# Patient Record
Sex: Female | Born: 1955 | Race: White | Hispanic: No | Marital: Married | Smoking: Current every day smoker
Health system: Southern US, Community
[De-identification: ages and names within clinical notes are randomized; demographics above are authoritative.]

## PROBLEM LIST (undated history)

## (undated) HISTORY — PX: BREAST SURGERY: SHX581

---

## 2004-12-13 ENCOUNTER — Ambulatory Visit: Payer: Self-pay | Admitting: General Surgery

## 2006-01-14 ENCOUNTER — Ambulatory Visit: Payer: Self-pay | Admitting: General Surgery

## 2006-01-21 ENCOUNTER — Ambulatory Visit: Payer: Self-pay | Admitting: General Surgery

## 2007-01-27 ENCOUNTER — Ambulatory Visit: Payer: Self-pay | Admitting: General Surgery

## 2008-02-23 ENCOUNTER — Ambulatory Visit: Payer: Self-pay | Admitting: General Surgery

## 2009-03-01 ENCOUNTER — Ambulatory Visit: Payer: Self-pay | Admitting: General Surgery

## 2010-03-14 ENCOUNTER — Ambulatory Visit: Payer: Self-pay | Admitting: General Surgery

## 2010-06-01 ENCOUNTER — Ambulatory Visit: Payer: Self-pay | Admitting: General Surgery

## 2010-06-05 ENCOUNTER — Ambulatory Visit: Payer: Self-pay | Admitting: General Surgery

## 2018-06-22 ENCOUNTER — Emergency Department
Admission: EM | Admit: 2018-06-22 | Discharge: 2018-06-22 | Disposition: A | Discharge status: Home / Self Care | Payer: BLUE CROSS/BLUE SHIELD | Attending: Emergency Medicine | Admitting: Emergency Medicine

## 2018-06-22 ENCOUNTER — Other Ambulatory Visit: Payer: Self-pay

## 2018-06-22 ENCOUNTER — Encounter: Payer: Self-pay | Admitting: Emergency Medicine

## 2018-06-22 ENCOUNTER — Emergency Department: Payer: BLUE CROSS/BLUE SHIELD

## 2018-06-22 DIAGNOSIS — I10 Essential (primary) hypertension: Secondary | ICD-10-CM | POA: Diagnosis not present

## 2018-06-22 DIAGNOSIS — F419 Anxiety disorder, unspecified: Secondary | ICD-10-CM | POA: Insufficient documentation

## 2018-06-22 DIAGNOSIS — R002 Palpitations: Secondary | ICD-10-CM | POA: Diagnosis present

## 2018-06-22 LAB — BASIC METABOLIC PANEL
Anion gap: 11 (ref 5–15)
BUN: 11 mg/dL (ref 8–23)
CO2: 25 mmol/L (ref 22–32)
CREATININE: 1.03 mg/dL — AB (ref 0.44–1.00)
Calcium: 9.8 mg/dL (ref 8.9–10.3)
Chloride: 105 mmol/L (ref 98–111)
GFR calc non Af Amer: 57 mL/min — ABNORMAL LOW (ref >60)
Glucose, Bld: 122 mg/dL — ABNORMAL HIGH (ref 70–99)
Potassium: 4 mmol/L (ref 3.5–5.1)
Sodium: 141 mmol/L (ref 135–145)

## 2018-06-22 LAB — TROPONIN I

## 2018-06-22 LAB — CBC
HCT: 44.4 % (ref 35.0–47.0)
Hemoglobin: 15 g/dL (ref 12.0–16.0)
MCH: 31.8 pg (ref 26.0–34.0)
MCHC: 33.7 g/dL (ref 32.0–36.0)
MCV: 94.2 fL (ref 80.0–100.0)
Platelets: 334 10*3/uL (ref 150–440)
RBC: 4.71 MIL/uL (ref 3.80–5.20)
RDW: 13.3 % (ref 11.5–14.5)
WBC: 13.1 10*3/uL — AB (ref 3.6–11.0)

## 2018-06-22 MED ORDER — LORAZEPAM 1 MG PO TABS
1.0000 mg | ORAL_TABLET | Freq: Once | ORAL | Status: AC
  Administered 2018-06-22: 1 mg via ORAL
  Filled 2018-06-22: qty 1

## 2018-06-22 MED ORDER — LORAZEPAM 0.5 MG PO TABS: 0.5000 mg | ORAL_TABLET | Freq: Three times a day (TID) | ORAL | 0 refills | Status: AC | PRN

## 2018-06-22 NOTE — ED Triage Notes (Signed)
Pt reports since Friday she has been feeling her blood pressure high and reports at times she feels her "heart beat on he head" reports at times she feels like she is going to have a panic attack. Pt talks in compete sentences no respiratory distress noted

## 2018-06-22 NOTE — ED Notes (Addendum)
.  Patient verbalizes understanding of d/c instructions, medications, and follow-up. Vital signs stable and pain controlled per pt. Patient In Not in Acute Distress at time of discharge and denies further concerns regarding this visit. Patient stable at the time of departure from the unit, departing unit by the safest and most appropriate manner per patient condition and limitations with all belongings accounted for. Patient advised to return to the ED at any time for emergent concerns, or for new/worsening symptoms. Work note was given to Pt as requested.

## 2018-06-22 NOTE — ED Provider Notes (Signed)
[facility] Department Provider Note  Time seen: 8:15 AM  I have reviewed the triage vital signs and the nursing notes.   HISTORY  Chief Complaint Hypertension    HPI Monique Waller is a 62 y.o. female with no significant past medical history presents to the emergency department with fatigue and palpitations.  According to the patient of past 2 to 3 days she has been feeling very weak and fatigued throughout the day, states at times she feels her heart beating in her head or neck.  States she has a history of high blood pressure in her family as well as a history of heart disease was very concerned.  Denies any chest pain.  Patient does state remote history of anxiety but has not had to take anxiety medication and multiple years.  Currently patient states she feels fairly well in the emergency department denies any chest pain or trouble breathing.  States she still feels somewhat anxious.   History reviewed. No pertinent past medical history.  There are no active problems to display for this patient.   Prior to Admission medications   Not on File    Not on File  No family history on file.  Social History Social History   Tobacco Use  . Smoking status: Unknown If Ever Smoked  . Smokeless tobacco: Current User  Substance Use Topics  . Alcohol use: Not on file  . Drug use: Not on file    Review of Systems Constitutional: Negative for fever. Cardiovascular: Negative for chest pain.  Occasional palpitations. Respiratory: Negative for shortness of breath. Gastrointestinal: Negative for abdominal pain, vomiting and diarrhea. Musculoskeletal: Negative for leg pain or swelling Skin: Negative for skin complaints  Neurological: Negative for headache All other ROS negative  ____________________________________________   PHYSICAL EXAM:  VITAL SIGNS: ED Triage Vitals  Enc Vitals Group     BP 06/22/18 0655 (!) 170/96     Pulse Rate  06/22/18 0655 77     Resp 06/22/18 0655 20     Temp 06/22/18 0655 99 F (37.2 C)     Temp Source 06/22/18 0655 Oral     SpO2 06/22/18 0655 100 %     Weight 06/22/18 0656 145 lb (65.8 kg)     Height 06/22/18 0656 4\' 11"  (1.499 m)     Head Circumference --      Peak Flow --      Pain Score 06/22/18 0655 2     Pain Loc --      Pain Edu? --      Excl. in GC? --    Constitutional: Alert and oriented. Well appearing and in no distress. Eyes: Normal exam ENT   Head: Normocephalic and atraumatic.   Mouth/Throat: Mucous membranes are moist. Cardiovascular: Normal rate, regular rhythm. No murmur Respiratory: Normal respiratory effort without tachypnea nor retractions. Breath sounds are clear Gastrointestinal: Soft and nontender. No distention.   Musculoskeletal: Nontender with normal range of motion in all extremities. No lower extremity tenderness or edema. Neurologic:  Normal speech and language. No gross focal neurologic deficits Skin:  Skin is warm, dry and intact.  Psychiatric: Mood and affect are normal. Speech and behavior are normal.   ____________________________________________    EKG  EKG reviewed and interpreted by myself shows sinus rhythm at 75 bpm with a narrow QRS, normal axis, normal intervals, no concerning ST changes.  ____________________________________________    RADIOLOGY  Chest x-ray negative  ____________________________________________   INITIAL IMPRESSION /  ASSESSMENT AND PLAN / ED COURSE  Pertinent labs & imaging results that were available during my care of the patient were reviewed by me and considered in my medical decision making (see chart for details).  Patient presents to the emergency department for palpitations and weakness.  Overall the patient appears very well, no complaints currently has time besides feeling mildly anxious.  Differential would include anxiety, ACS, hypertension, palpitations, electrolyte or metabolic abnormality.   Labs are largely within normal limits, chest x-ray is normal and EKG appears reassuring.  We will dose 1 mg of oral Ativan and continue to reassess.  Blood pressure currently in the 140s over 80s.   Patient is feeling much better, blood pressure currently 130s over 70s.  We will discharge with primary care follow-up.  We will discharge with a very short course of Ativan as well.  Patient agreeable to plan of care. ____________________________________________   FINAL CLINICAL IMPRESSION(S) / ED DIAGNOSES  Hypertension Anxiety    Minna Antis, MD 06/22/18 [phone]

## 2018-11-14 ENCOUNTER — Encounter: Payer: Self-pay | Admitting: Emergency Medicine

## 2018-11-14 ENCOUNTER — Other Ambulatory Visit: Payer: Self-pay

## 2018-11-14 ENCOUNTER — Emergency Department
Admission: EM | Admit: 2018-11-14 | Discharge: 2018-11-14 | Disposition: A | Discharge status: Home / Self Care | Payer: BLUE CROSS/BLUE SHIELD | Attending: Emergency Medicine | Admitting: Emergency Medicine

## 2018-11-14 DIAGNOSIS — Y999 Unspecified external cause status: Secondary | ICD-10-CM | POA: Diagnosis not present

## 2018-11-14 DIAGNOSIS — Z79899 Other long term (current) drug therapy: Secondary | ICD-10-CM | POA: Diagnosis not present

## 2018-11-14 DIAGNOSIS — Y929 Unspecified place or not applicable: Secondary | ICD-10-CM | POA: Insufficient documentation

## 2018-11-14 DIAGNOSIS — S61012A Laceration without foreign body of left thumb without damage to nail, initial encounter: Secondary | ICD-10-CM | POA: Insufficient documentation

## 2018-11-14 DIAGNOSIS — W268XXA Contact with other sharp object(s), not elsewhere classified, initial encounter: Secondary | ICD-10-CM | POA: Insufficient documentation

## 2018-11-14 DIAGNOSIS — S61211A Laceration without foreign body of left index finger without damage to nail, initial encounter: Secondary | ICD-10-CM

## 2018-11-14 DIAGNOSIS — Z23 Encounter for immunization: Secondary | ICD-10-CM | POA: Insufficient documentation

## 2018-11-14 DIAGNOSIS — Y9389 Activity, other specified: Secondary | ICD-10-CM | POA: Diagnosis not present

## 2018-11-14 DIAGNOSIS — F17210 Nicotine dependence, cigarettes, uncomplicated: Secondary | ICD-10-CM | POA: Insufficient documentation

## 2018-11-14 MED ORDER — HYDROCODONE-ACETAMINOPHEN 5-325 MG PO TABS: 1.0000 | ORAL_TABLET | Freq: Four times a day (QID) | ORAL | 0 refills | Status: AC | PRN

## 2018-11-14 MED ORDER — LIDOCAINE HCL (PF) 1 % IJ SOLN
10.0000 mL | Freq: Once | INTRAMUSCULAR | Status: AC
  Administered 2018-11-14: 10 mL
  Filled 2018-11-14: qty 10

## 2018-11-14 MED ORDER — TETANUS-DIPHTH-ACELL PERTUSSIS 5-2.5-18.5 LF-MCG/0.5 IM SUSP
0.5000 mL | Freq: Once | INTRAMUSCULAR | Status: AC
  Administered 2018-11-14: 0.5 mL via INTRAMUSCULAR
  Filled 2018-11-14: qty 0.5

## 2018-11-14 MED ORDER — CEPHALEXIN 500 MG PO CAPS: 500.0000 mg | ORAL_CAPSULE | Freq: Three times a day (TID) | ORAL | 0 refills | Status: AC

## 2018-11-14 NOTE — Discharge Instructions (Addendum)
Follow-up with your regular doctor, in acute care, or the ED in 7 to 10 days for suture removal.  Keep the areas as dry as possible.  If you need to wash your hands you may wash them with soap and water.  Dry them carefully.  Return if any sign of infection.

## 2018-11-14 NOTE — ED Provider Notes (Signed)
[facility] Emergency Department Provider Note  ____________________________________________   First MD Initiated Contact with Patient 11/14/18 1315     (approximate)  I have reviewed the triage vital signs and the nursing notes.   HISTORY  Chief Complaint Laceration    HPI Monique Waller is a 63 y.o. female presents emergency department with a laceration to the left thumb and index finger.  She states she was opening a box with a box cutter and it slipped and cut her hand.  She is unsure of the last tetanus stating that she knows it is more than 5 to 10 years.  She denies any other injuries.    History reviewed. No pertinent past medical history.  There are no active problems to display for this patient.   Past Surgical History:  Procedure Laterality Date  . BREAST SURGERY      Prior to Admission medications   Medication Sig Start Date End Date Taking? Authorizing Provider  acetaminophen (TYLENOL) 500 MG tablet Take 1,000 mg by mouth every 6 (six) hours as needed for mild pain.    [provider]  cephALEXin (KEFLEX) 500 MG capsule Take 1 capsule (500 mg total) by mouth 3 (three) times daily. 11/14/18   Sherrie Mustache Roselyn Bering, PA-C  HYDROcodone-acetaminophen (NORCO/VICODIN) 5-325 MG tablet Take 1 tablet by mouth every 6 (six) hours as needed for moderate pain. 11/14/18   Sheron Tallman, Roselyn Bering, PA-C  LORazepam (ATIVAN) 0.5 MG tablet Take 1 tablet (0.5 mg total) by mouth every 8 (eight) hours as needed for anxiety. 06/22/18 06/22/19  Minna Antis, MD  triprolidine-pseudoephedrine (APRODINE) 2.5-60 MG TABS tablet Take 1 tablet by mouth every 6 (six) hours as needed for allergies.    [provider]    Allergies Patient has no known allergies.  History reviewed. No pertinent family history.  Social History Social History   Tobacco Use  . Smoking status: Current Every Day Smoker  . Smokeless tobacco: Current User  Substance Use Topics  .  Alcohol use: Never    Frequency: Never  . Drug use: Never    Review of Systems  Constitutional: No fever/chills Eyes: No visual changes. ENT: No sore throat. Respiratory: Denies cough Genitourinary: Negative for dysuria. Musculoskeletal: Negative for back pain.  Positive laceration to the left thumb and left index finger Skin: Negative for rash.    ____________________________________________   PHYSICAL EXAM:  VITAL SIGNS: ED Triage Vitals  Enc Vitals Group     BP 11/14/18 1208 102/69     Pulse Rate 11/14/18 1206 74     Resp 11/14/18 1206 18     Temp 11/14/18 1206 98.1 F (36.7 C)     Temp Source 11/14/18 1206 Oral     SpO2 11/14/18 1206 95 %     Weight 11/14/18 1207 140 lb (63.5 kg)     Height 11/14/18 1207 4\' 11"  (1.499 m)     Head Circumference --      Peak Flow --      Pain Score 11/14/18 1206 1     Pain Loc --      Pain Edu? --      Excl. in GC? --     Constitutional: Alert and oriented. Well appearing and in no acute distress. Eyes: Conjunctivae are normal.  Head: Atraumatic. Nose: No congestion/rhinnorhea. Mouth/Throat: Mucous membranes are moist.   Neck:  supple no lymphadenopathy noted Cardiovascular: Normal rate, regular rhythm. Heart sounds are normal Respiratory: Normal respiratory effort.  No retractions, lungs c t a  GU: deferred Musculoskeletal: FROM all extremities, warm and well perfused, fingers have full range of motion, the left index finger has a laceration along the side of the finger that stretches from the distal to the proximal phalanx, at the thumb has a laceration that is along the distal portion, there is another laceration at the base of the thumb.  No foreign bodies are noted.  No tendon involvement is noted. Neurologic:  Normal speech and language.  Skin:  Skin is warm, dry.  No rash noted. Psychiatric: Mood and affect are normal. Speech and behavior are normal.  ____________________________________________   LABS (all labs  ordered are listed, but only abnormal results are displayed)  Labs Reviewed - No data to display ____________________________________________   ____________________________________________  RADIOLOGY    ____________________________________________   PROCEDURES  Procedure(s) performed:   Marland KitchenMarland KitchenLaceration Repair Date/Time: 11/14/2018 6:01 PM Performed by: Faythe Ghee, PA-C Authorized by: Faythe Ghee, PA-C   Consent:    Consent obtained:  Verbal   Consent given by:  Patient   Risks discussed:  Infection, pain, retained foreign body, tendon damage, poor cosmetic result and poor wound healing   Alternatives discussed:  Delayed treatment Anesthesia (see MAR for exact dosages):    Anesthesia method:  Nerve block   Block anesthetic:  Lidocaine 1% w/o epi   Block injection procedure:  Anatomic landmarks identified, introduced needle, incremental injection, anatomic landmarks palpated and negative aspiration for blood   Block outcome:  Anesthesia achieved Laceration details:    Location:  Finger   Finger location:  L thumb   Length (cm):  5   Depth (mm):  3 Repair type:    Repair type:  Simple Pre-procedure details:    Preparation:  Patient was prepped and draped in usual sterile fashion Exploration:    Hemostasis achieved with:  Direct pressure   Wound exploration: wound explored through full range of motion     Wound extent: no foreign bodies/material noted, no tendon damage noted and no underlying fracture noted   Treatment:    Area cleansed with:  Betadine and saline   Amount of cleaning:  Standard   Irrigation solution:  Sterile saline   Irrigation method:  Syringe and tap Skin repair:    Repair method:  Sutures   Suture size:  5-0   Suture material:  Nylon   Suture technique:  Simple interrupted   Number of sutures:  12 Approximation:    Approximation:  Close Post-procedure details:    Dressing:  Non-adherent dressing   Patient tolerance of procedure:   Tolerated well, no immediate complications .Marland KitchenLaceration Repair Date/Time: 11/14/2018 6:03 PM Performed by: Faythe Ghee, PA-C Authorized by: Faythe Ghee, PA-C   Consent:    Consent obtained:  Verbal   Consent given by:  Patient   Risks discussed:  Infection, pain, retained foreign body, tendon damage, poor cosmetic result and poor wound healing   Alternatives discussed:  Delayed treatment Anesthesia (see MAR for exact dosages):    Anesthesia method:  Nerve block   Block anesthetic:  Lidocaine 1% w/o epi   Block injection procedure:  Anatomic landmarks identified, introduced needle, incremental injection, anatomic landmarks palpated and negative aspiration for blood   Block outcome:  Anesthesia achieved Laceration details:    Location:  Finger   Finger location:  L index finger   Length (cm):  3   Depth (mm):  2 Repair type:    Repair type:  Simple Pre-procedure details:    Preparation:  Patient was prepped and draped in usual sterile fashion Exploration:    Hemostasis achieved with:  Direct pressure   Wound exploration: wound explored through full range of motion     Wound extent: no foreign bodies/material noted, no muscle damage noted, no tendon damage noted, no underlying fracture noted and no vascular damage noted     Contaminated: no   Treatment:    Area cleansed with:  Betadine and saline   Amount of cleaning:  Standard   Irrigation solution:  Sterile saline   Irrigation method:  Syringe and tap Skin repair:    Repair method:  Sutures   Suture size:  5-0   Suture material:  Nylon   Suture technique:  Simple interrupted   Number of sutures:  8 Approximation:    Approximation:  Close Post-procedure details:    Dressing:  Non-adherent dressing   Patient tolerance of procedure:  Tolerated well, no immediate complications      ____________________________________________   INITIAL IMPRESSION / ASSESSMENT AND PLAN / ED COURSE  Pertinent labs & imaging  results that were available during my care of the patient were reviewed by me and considered in my medical decision making (see chart for details).   Patient is a 63 year old female presents emergency department with laceration to the thumb and left index finger.  Physical exam shows lacerations.  See procedure note for repairs.  No tendon involvement is noted.  Patient was given a Tdap while here in the ED.  She was given a prescription for Keflex and Vicodin as needed for pain.  She is to keep the areas dry as possible.  Return emergency department for any sign of infection.  Clean the area with soap and water daily.  She states she understands and will comply.  She was discharged in stable condition.     As part of my medical decision making, I reviewed the following data within the electronic MEDICAL RECORD NUMBER Nursing notes reviewed and incorporated, Old chart reviewed, Notes from prior ED visits and [place] Controlled Substance Database  ____________________________________________   FINAL CLINICAL IMPRESSION(S) / ED DIAGNOSES  Final diagnoses:  Laceration of left thumb without foreign body without damage to nail, initial encounter  Laceration of left index finger without foreign body without damage to nail, initial encounter      NEW MEDICATIONS STARTED DURING THIS VISIT:  Discharge Medication List as of 11/14/2018  2:18 PM    START taking these medications   Details  cephALEXin (KEFLEX) 500 MG capsule Take 1 capsule (500 mg total) by mouth 3 (three) times daily., Starting Sat 11/14/2018, Normal    HYDROcodone-acetaminophen (NORCO/VICODIN) 5-325 MG tablet Take 1 tablet by mouth every 6 (six) hours as needed for moderate pain., Starting Sat 11/14/2018, Normal         Note:  This document was prepared using Dragon voice recognition software and may include unintentional dictation errors.    Faythe GheeFisher, Ravi Tuccillo W, PA-C 11/14/18 1806    Arnaldo NatalMalinda, Paul F, MD 11/18/18 [phone]

## 2018-11-14 NOTE — ED Triage Notes (Addendum)
Pt cut hand with box cutter. Laceration X 2 to left thumb. Laceration to 2nd digit left hand. 2nd digit bleeding. Pt instructed to hold firm pressure 20 minutes without letting go. Last Tdap 5-10 yrs

## 2019-10-21 ENCOUNTER — Ambulatory Visit: Payer: BLUE CROSS/BLUE SHIELD | Attending: Internal Medicine

## 2019-10-21 DIAGNOSIS — Z20822 Contact with and (suspected) exposure to covid-19: Secondary | ICD-10-CM

## 2019-10-23 LAB — NOVEL CORONAVIRUS, NAA: SARS-CoV-2, NAA: NOT DETECTED

## 2020-04-20 IMAGING — CR DG CHEST 2V
2 series · 2 of 2 positions shown · non-contrast
Comparison: None.

CLINICAL DATA: Chest pressure.

EXAM:
CHEST - 2 VIEW

[chest pa]
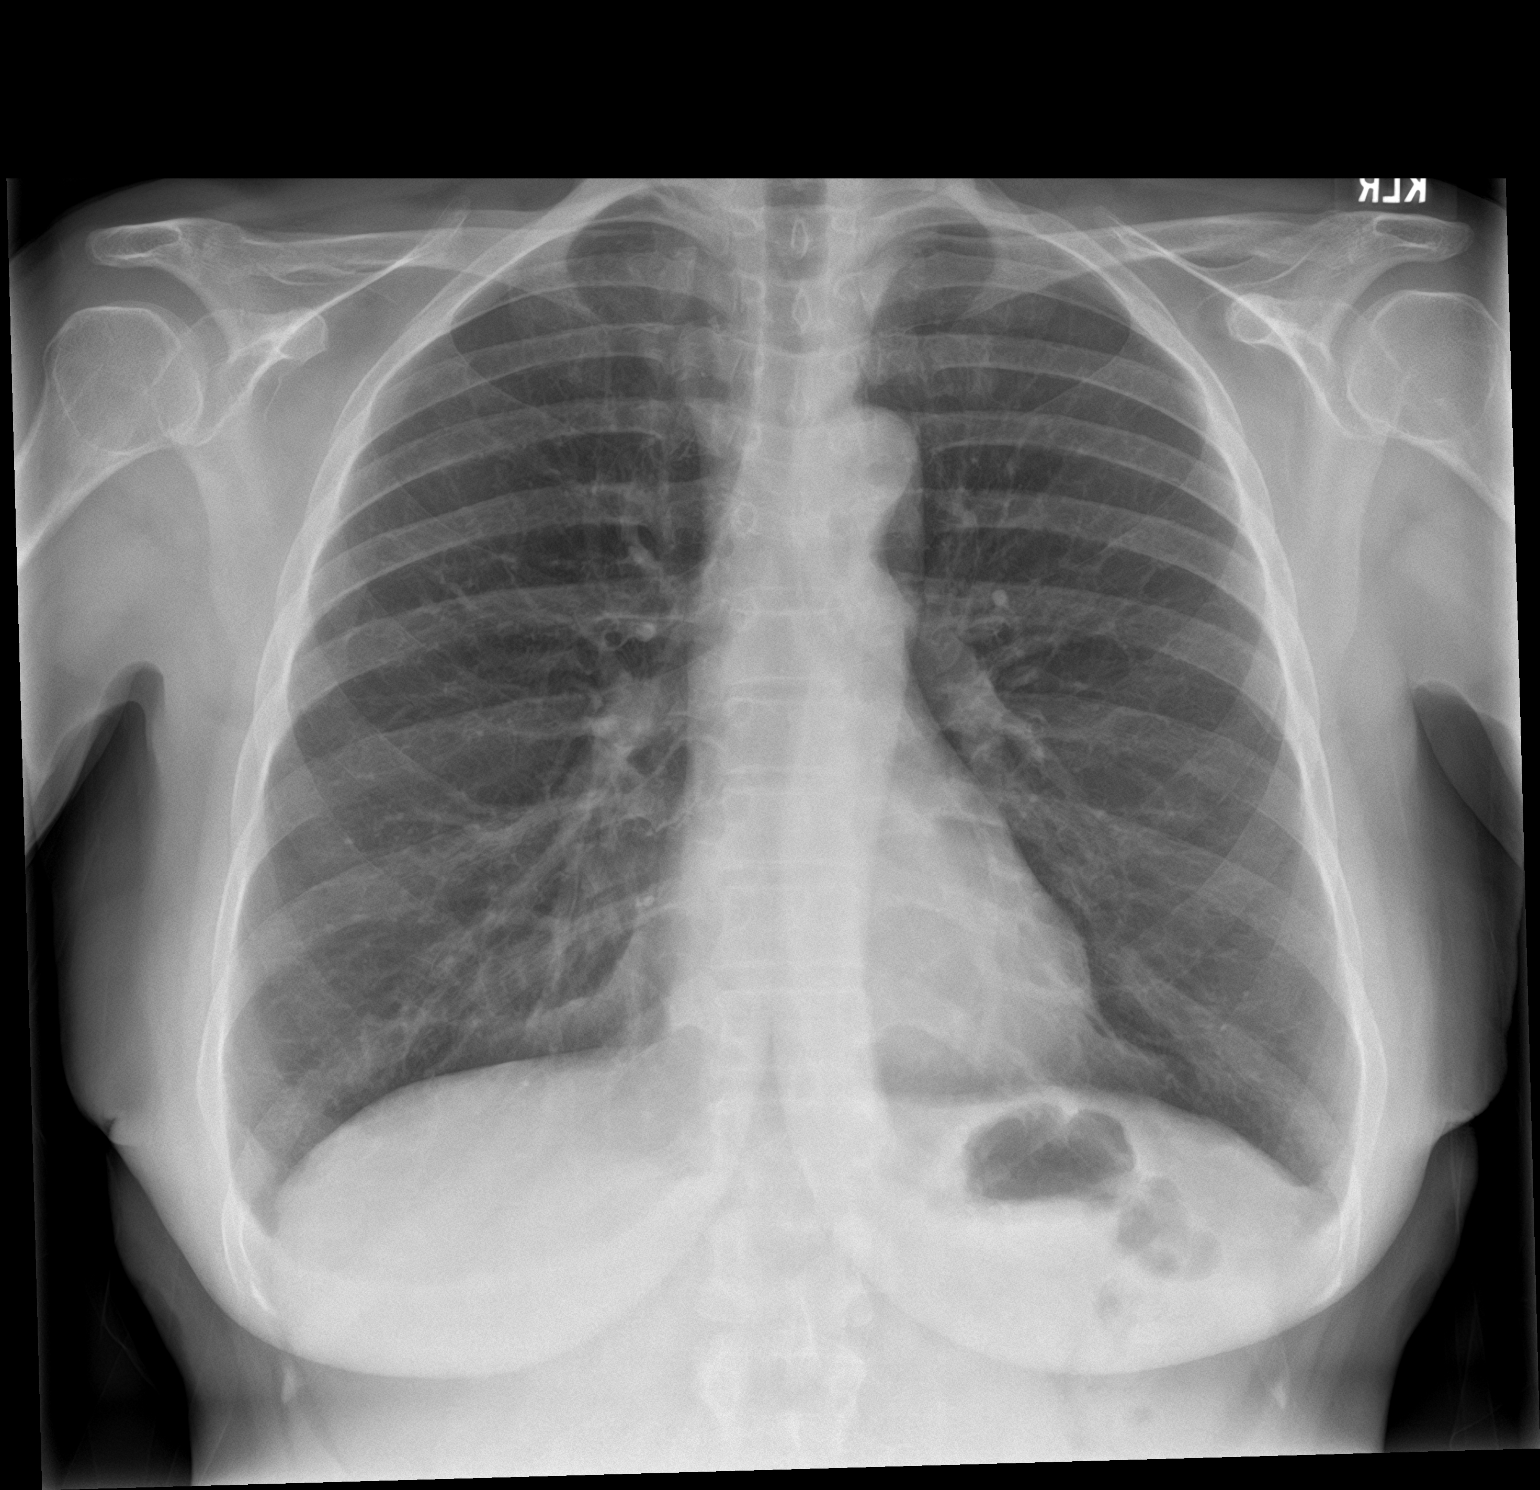

[chest lat]
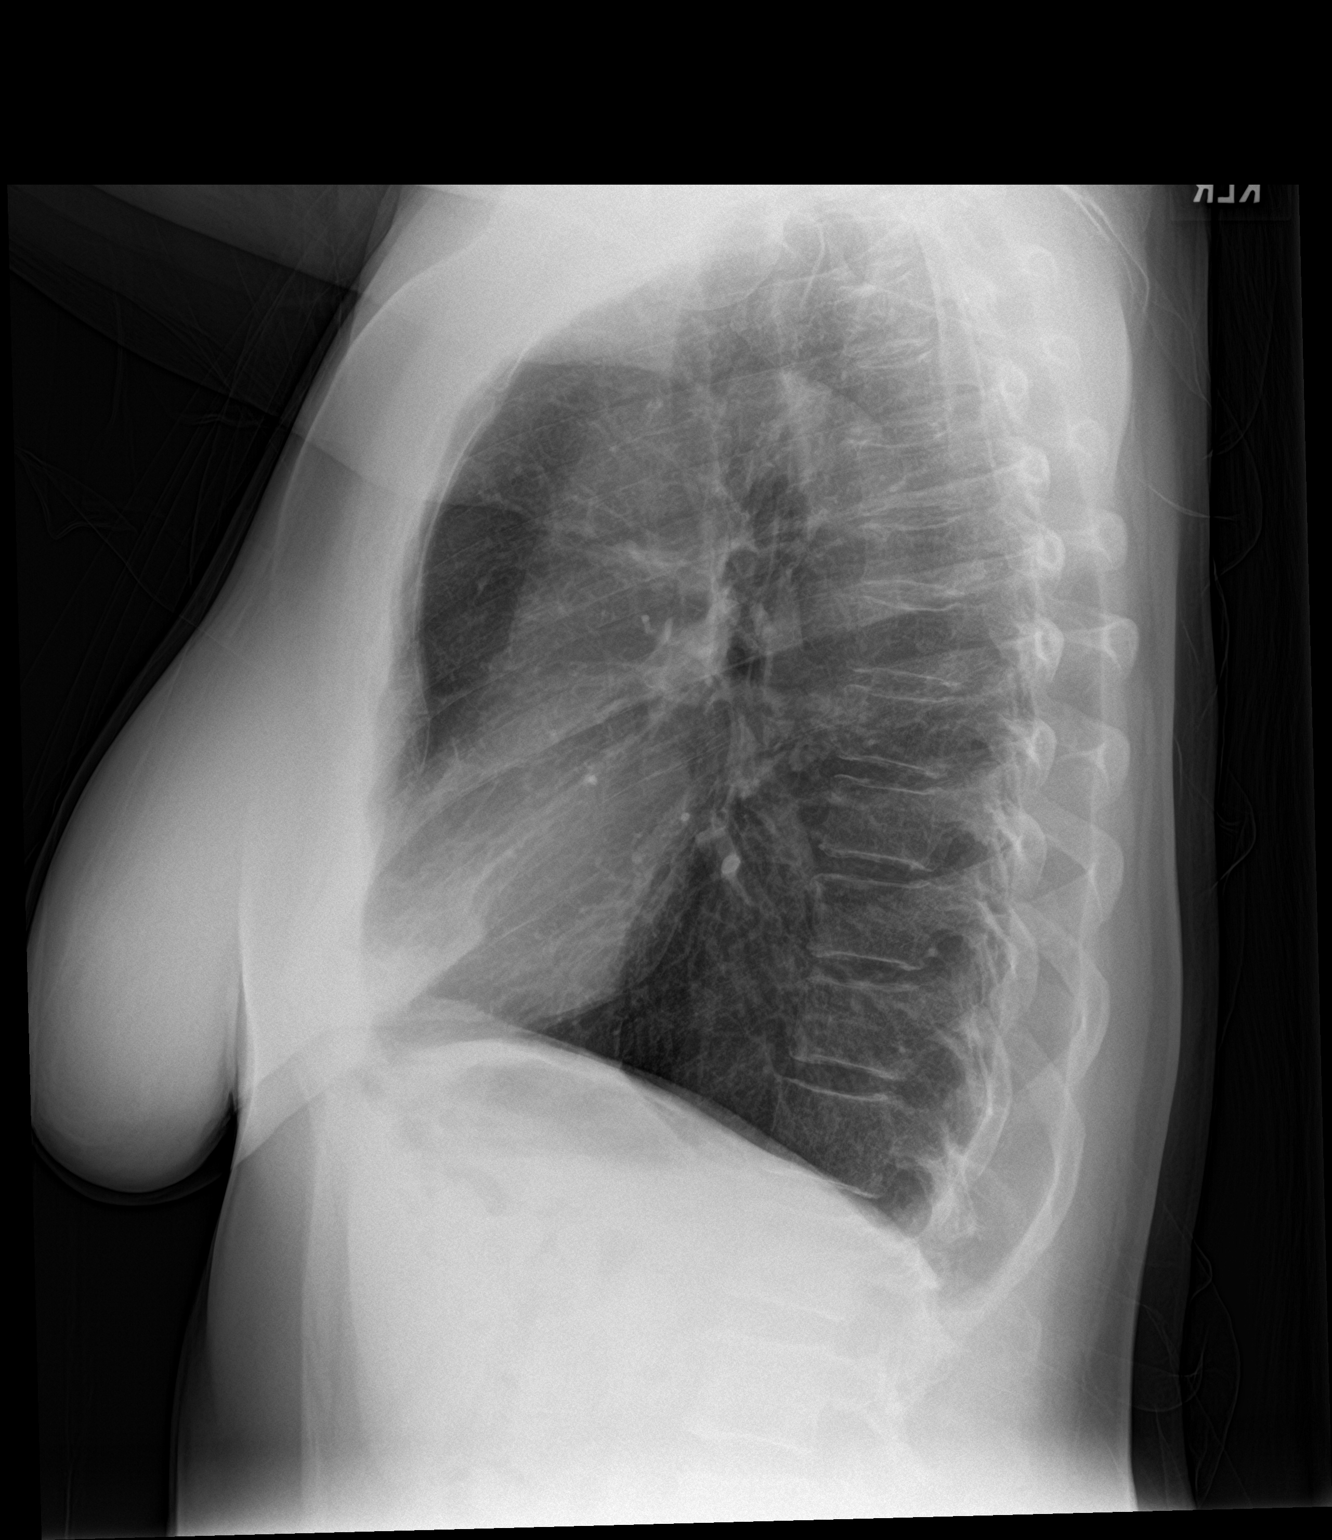

[2 of 2 positions shown; findings below may reference images not displayed]

FINDINGS: The heart size and mediastinal contours are within normal limits.
Both lungs are clear. The visualized skeletal structures are
unremarkable.
IMPRESSION: No active cardiopulmonary disease.
